# Patient Record
Sex: Male | Born: 1985 | Race: White | Hispanic: No | Marital: Single | Smoking: Never smoker
Health system: Southern US, Community
[De-identification: ages and names within clinical notes are randomized; demographics above are authoritative.]

## PROBLEM LIST (undated history)

## (undated) DIAGNOSIS — F553 Abuse of steroids or hormones: Secondary | ICD-10-CM

## (undated) DIAGNOSIS — F1910 Other psychoactive substance abuse, uncomplicated: Secondary | ICD-10-CM

## (undated) DIAGNOSIS — F988 Other specified behavioral and emotional disorders with onset usually occurring in childhood and adolescence: Secondary | ICD-10-CM

---

## 2008-11-03 ENCOUNTER — Emergency Department (HOSPITAL_BASED_OUTPATIENT_CLINIC_OR_DEPARTMENT_OTHER): Admission: EM | Admit: 2008-11-03 | Discharge: 2008-11-03 | Payer: Self-pay | Admitting: Emergency Medicine

## 2008-11-03 ENCOUNTER — Ambulatory Visit: Payer: Self-pay | Admitting: Diagnostic Radiology

## 2010-06-06 LAB — BASIC METABOLIC PANEL
BUN: 12 mg/dL (ref 6–23)
Calcium: 9.4 mg/dL (ref 8.4–10.5)
Creatinine, Ser: 1.1 mg/dL (ref 0.4–1.5)
GFR calc Af Amer: >60 mL/min (ref >60)
GFR calc non Af Amer: >60 mL/min (ref >60)

## 2010-06-06 LAB — POCT TOXICOLOGY PANEL: Tetrahydrocannabinol: POSITIVE

## 2010-06-06 LAB — DIFFERENTIAL
Basophils Relative: 1 % (ref 0–1)
Eosinophils Relative: 0 % (ref 0–5)
Lymphocytes Relative: 63 % — ABNORMAL HIGH (ref 12–46)
Monocytes Absolute: 1.5 10*3/uL — ABNORMAL HIGH (ref 0.1–1.0)
Neutrophils Relative %: 19 % — ABNORMAL LOW (ref 43–77)

## 2010-06-06 LAB — PATHOLOGIST SMEAR REVIEW

## 2010-06-06 LAB — CBC
Platelets: 169 10*3/uL (ref 150–400)
RBC: 4.47 MIL/uL (ref 4.22–5.81)
WBC: 8.9 10*3/uL (ref 4.0–10.5)

## 2010-06-15 IMAGING — CR DG CHEST 2V
2 series · 2 of 2 positions shown · non-contrast
Comparison: None.

CLINICAL DATA: Shortness of breath.  Palpitations.  .

CHEST - 2 VIEW

[w chest pa]
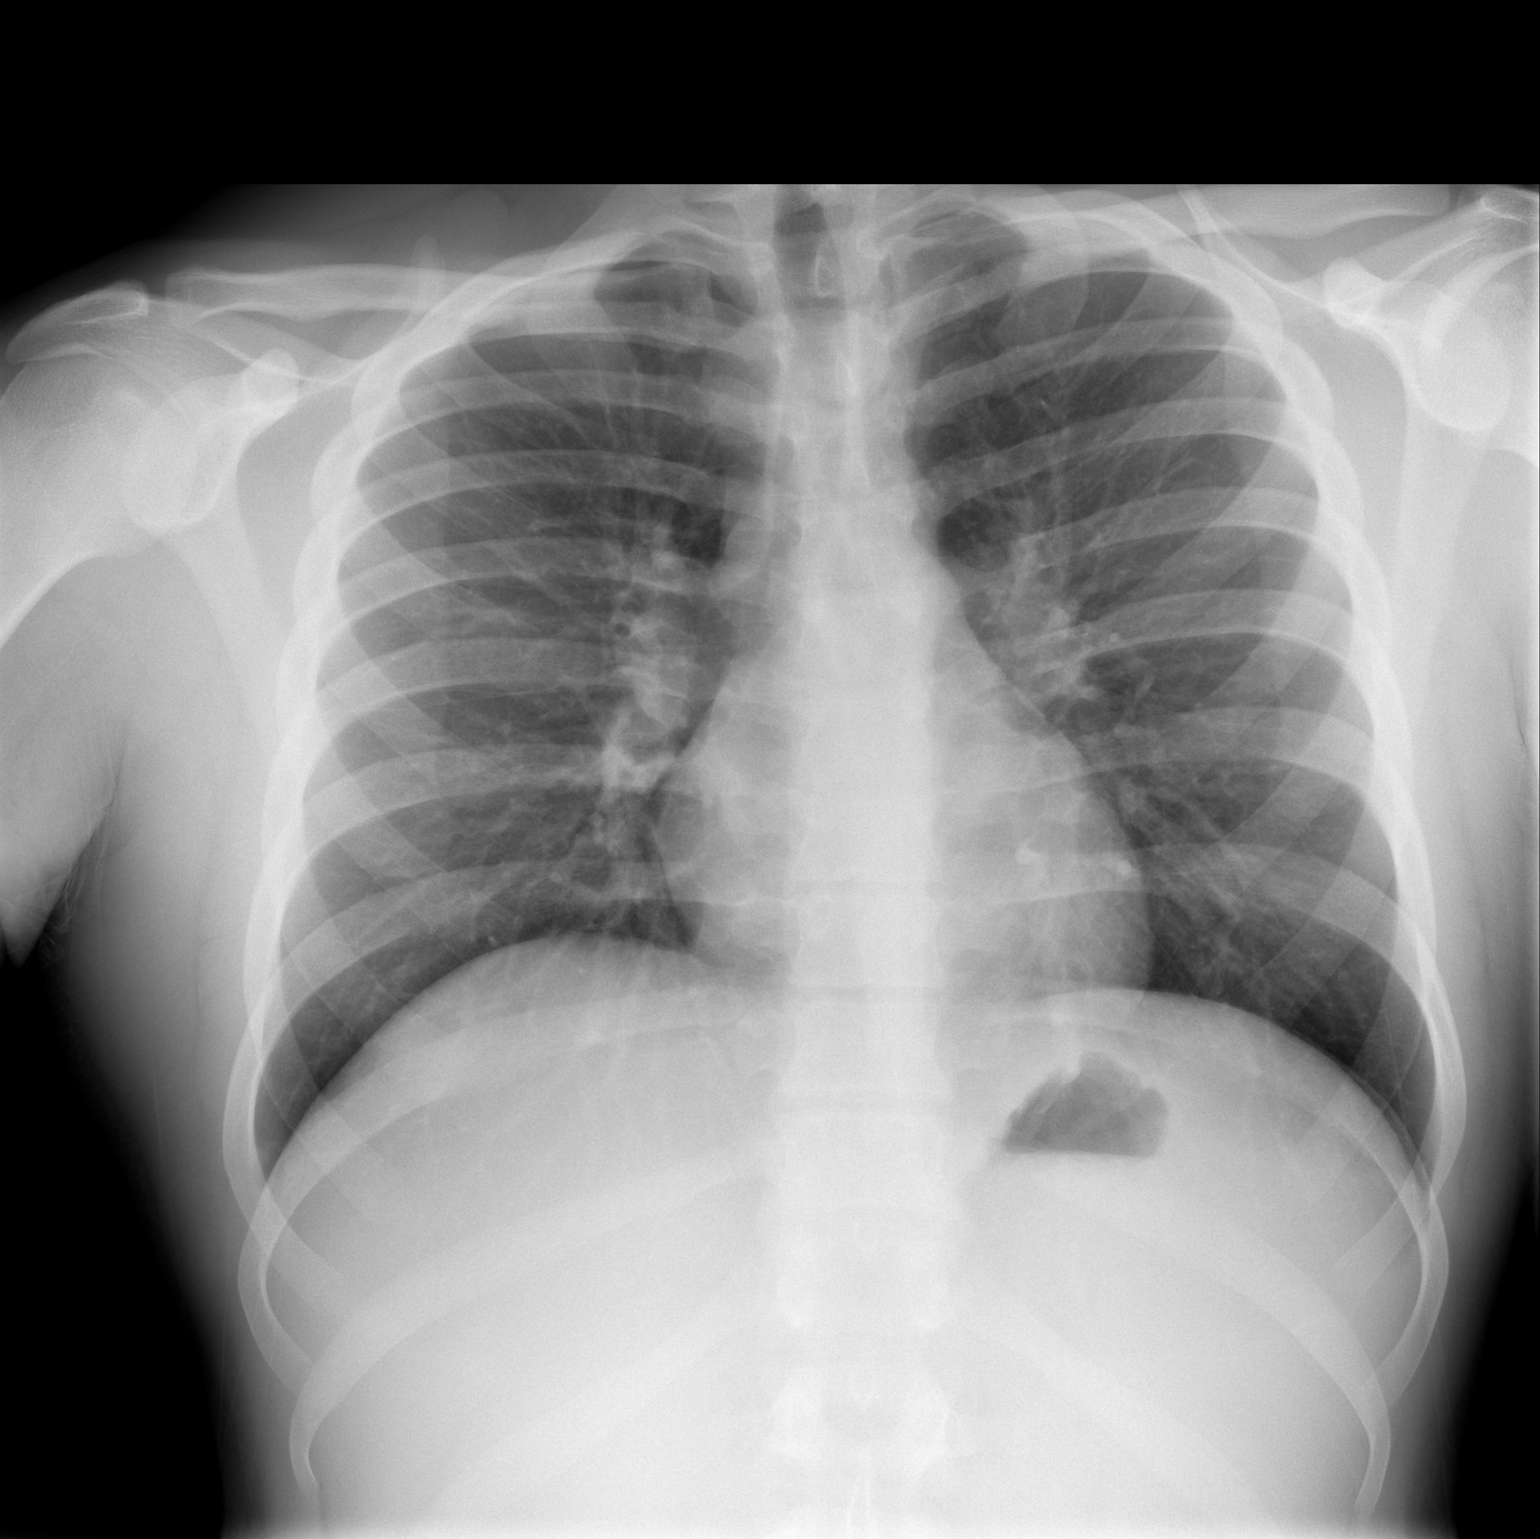

[w chest lat]
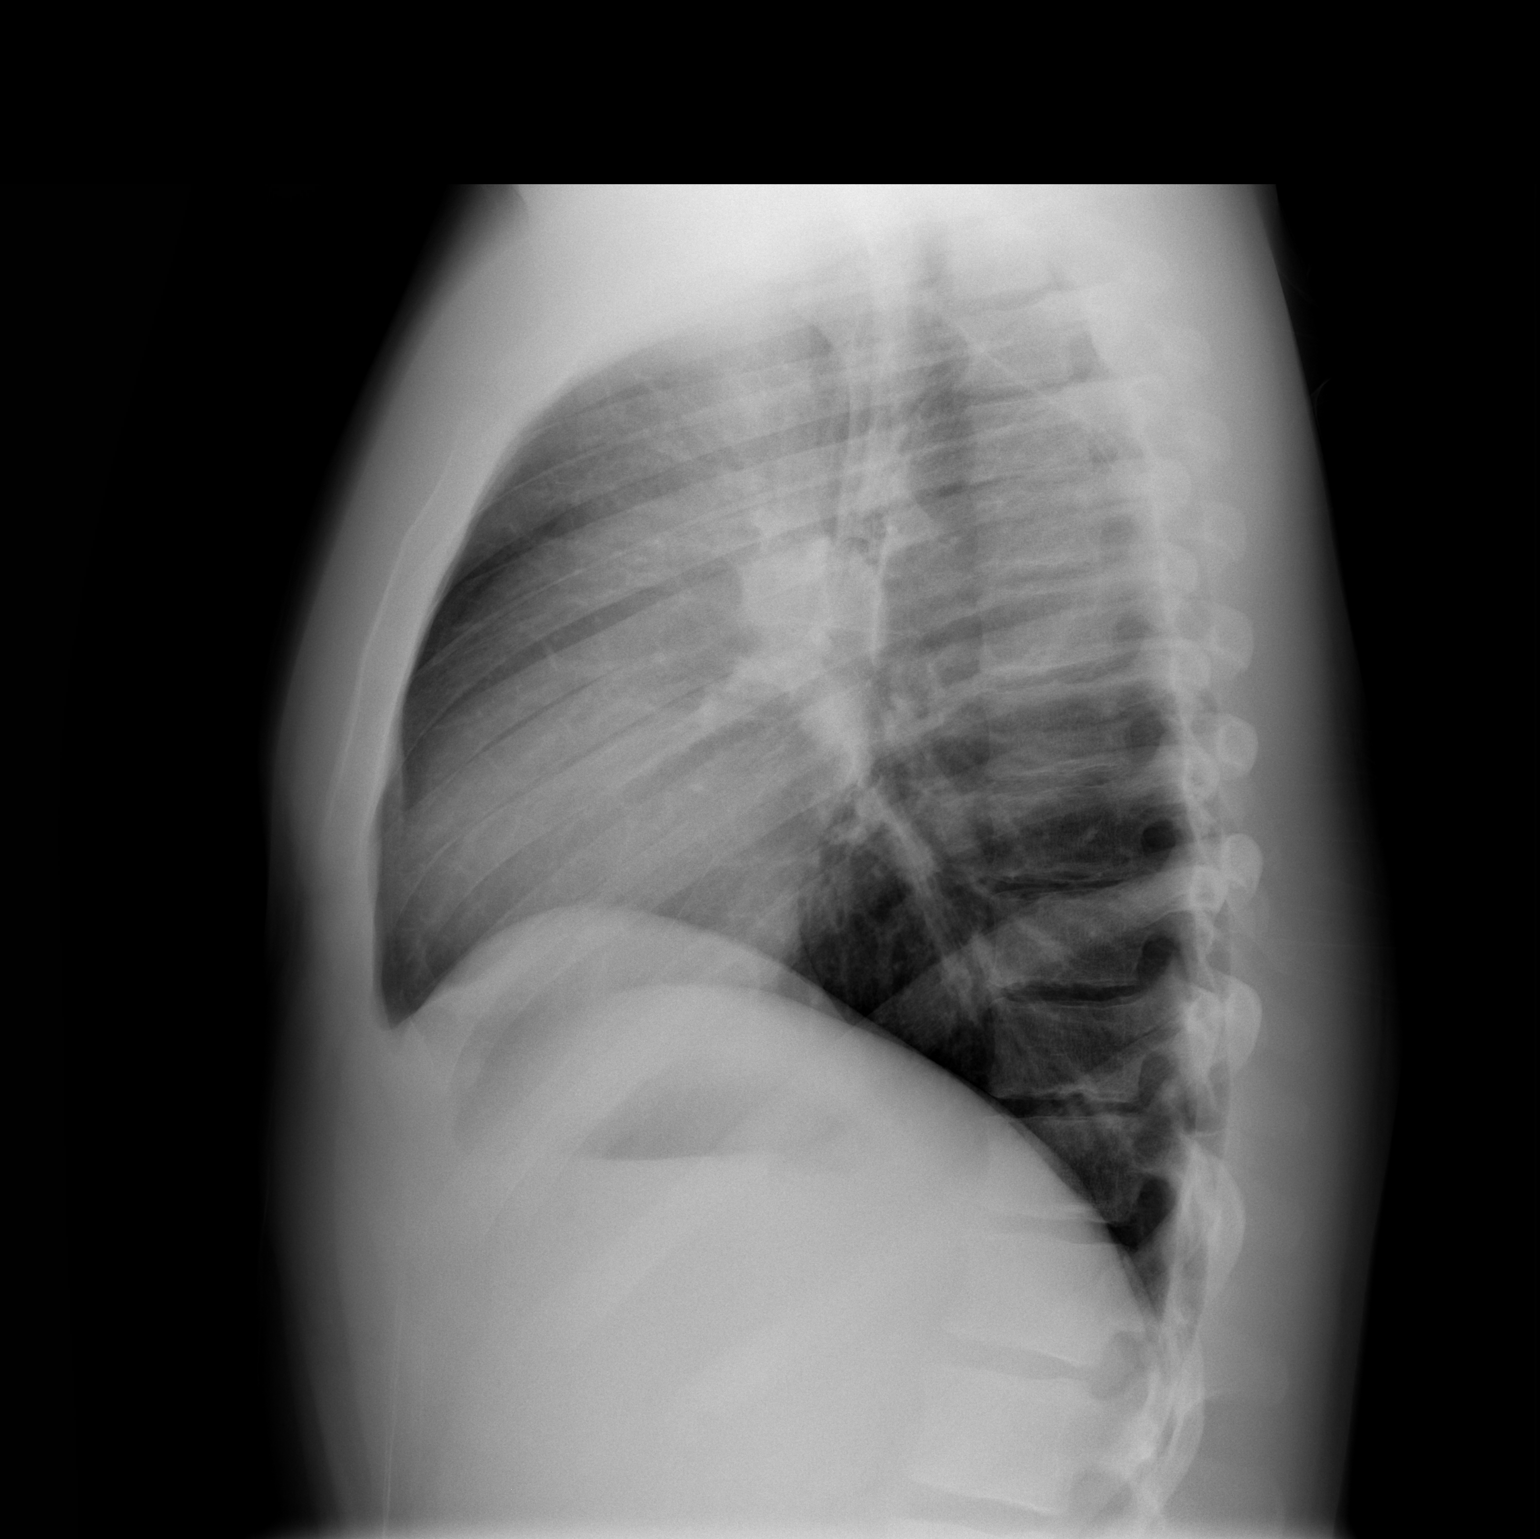

[2 of 2 positions shown; findings below may reference images not displayed]

FINDINGS: The lungs are clear without focal consolidation, edema,
effusion or pneumothorax.  Cardiopericardial silhouette is within
normal limits for size.  Imaged bony structures of the thorax are
intact.
IMPRESSION: No acute cardiopulmonary process.

## 2012-08-15 ENCOUNTER — Encounter (HOSPITAL_BASED_OUTPATIENT_CLINIC_OR_DEPARTMENT_OTHER): Payer: Self-pay | Admitting: *Deleted

## 2012-08-15 ENCOUNTER — Emergency Department (HOSPITAL_BASED_OUTPATIENT_CLINIC_OR_DEPARTMENT_OTHER)
Admission: EM | Admit: 2012-08-15 | Discharge: 2012-08-15 | Disposition: A | Discharge status: Home / Self Care | Payer: BC Managed Care – PPO | Attending: Emergency Medicine | Admitting: Emergency Medicine

## 2012-08-15 DIAGNOSIS — R634 Abnormal weight loss: Secondary | ICD-10-CM | POA: Insufficient documentation

## 2012-08-15 DIAGNOSIS — X503XXA Overexertion from repetitive movements, initial encounter: Secondary | ICD-10-CM | POA: Insufficient documentation

## 2012-08-15 DIAGNOSIS — F988 Other specified behavioral and emotional disorders with onset usually occurring in childhood and adolescence: Secondary | ICD-10-CM | POA: Insufficient documentation

## 2012-08-15 DIAGNOSIS — Z79899 Other long term (current) drug therapy: Secondary | ICD-10-CM | POA: Insufficient documentation

## 2012-08-15 DIAGNOSIS — Y9389 Activity, other specified: Secondary | ICD-10-CM | POA: Insufficient documentation

## 2012-08-15 DIAGNOSIS — S239XXA Sprain of unspecified parts of thorax, initial encounter: Secondary | ICD-10-CM | POA: Insufficient documentation

## 2012-08-15 DIAGNOSIS — S29019A Strain of muscle and tendon of unspecified wall of thorax, initial encounter: Secondary | ICD-10-CM

## 2012-08-15 DIAGNOSIS — Y9289 Other specified places as the place of occurrence of the external cause: Secondary | ICD-10-CM | POA: Insufficient documentation

## 2012-08-15 HISTORY — DX: Other specified behavioral and emotional disorders with onset usually occurring in childhood and adolescence: F98.8

## 2012-08-15 MED ORDER — IBUPROFEN 800 MG PO TABS: 800.0000 mg | ORAL_TABLET | Freq: Three times a day (TID) | ORAL | Status: DC

## 2012-08-15 MED ORDER — HYDROCODONE-ACETAMINOPHEN 5-325 MG PO TABS: 2.0000 | ORAL_TABLET | ORAL | Status: DC | PRN

## 2012-08-15 NOTE — ED Notes (Addendum)
Pt c/o lower back pain x 1 day seen by PMD today for same given flexeril w/o relief

## 2012-08-15 NOTE — ED Notes (Signed)
MD at bedside. 

## 2012-08-15 NOTE — ED Provider Notes (Signed)
History     This chart was scribed for Glynn Octave, MD by Jiles Prows, ED Scribe. The patient was seen in room MH05/MH05 and the patient's care was started at 10:46 PM.    CSN: [id number]  Arrival date & time 08/15/12  2220   Chief Complaint  Patient presents with  . Back Pain   The history is provided by the patient and medical records. No language interpreter was used.   HPI Comments: Daryl Holt is a 27 y.o. male who presents to the Emergency Department complaining of gradual onset, worsening, moderate, pain to back beginning Sunday night.  Pt reports that the pain is in the center of the middle of his back. Pt reports that the back pain stops him from exhaling all the way or taking a full breath.   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  He states that he has not lost control of bowels or bladder.  Pt denies headache, diaphoresis, fever, chills, nausea, vomiting, diarrhea, weakness, cough, SOB and any other pain.   Pt saw his PMD today and was perscribed flexeril for pain which he took with no relief.  Past Medical History  Diagnosis Date  . ADD (attention deficit disorder)     History reviewed. No pertinent past surgical history.  History reviewed. No pertinent family history.  History  Substance Use Topics  . Smoking status: Never Smoker   . Smokeless tobacco: Not on file  . Alcohol Use: No      Review of Systems A complete 10 system review of systems was obtained and all systems are negative except as noted in the HPI and PMH.   Allergies  Review of patient's allergies indicates no known allergies.  Home Medications   Current Outpatient Rx  Name  Route  Sig  Dispense  Refill  . dextroamphetamine (DEXEDRINE SPANSULE) 10 MG 24 hr capsule   Oral   Take 10 mg by mouth daily.         Marland Kitchen HYDROcodone-acetaminophen (NORCO/VICODIN) 5-325 MG per tablet   Oral   Take 2 tablets by mouth every 4 (four) hours as needed for pain.  10 tablet   0   . ibuprofen (ADVIL,MOTRIN) 800 MG tablet   Oral   Take 1 tablet (800 mg total) by mouth 3 (three) times daily.   21 tablet   0     BP 125/67  Pulse 68  Temp(Src) 97.8 F (36.6 C) (Oral)  Resp 20  Ht 5\' 11"  (1.803 m)  Wt 200 lb (90.719 kg)  BMI 27.91 kg/m2  SpO2 100%  Physical Exam  Nursing note and vitals reviewed. Constitutional: He is oriented to person, place, and time. He appears well-developed and well-nourished. No distress.  HENT:  Head: Normocephalic and atraumatic.  Mouth/Throat: Oropharynx is clear and moist.  Eyes: EOM are normal. Pupils are equal, round, and reactive to light.  Neck: Neck supple. No tracheal deviation present.  Cardiovascular: Normal rate.   Pulmonary/Chest: Effort normal. No respiratory distress.  Abdominal: Soft.  Musculoskeletal: Normal range of motion.  Thoracic paraspinal tenderness.  Midline tenderness. 5/5 strength in bilateral lower extremities. Ankle plantar and dorsiflexion intact. Great toe extension intact bilaterally. +2 DP and PT pulses. +2 patellar reflexes bilaterally. Normal gait.   Neurological: He is alert and oriented to person, place, and time.  Skin: Skin is warm and dry.  Psychiatric: He has a normal mood and affect. His behavior is normal.    ED Course  Procedures (including critical care time) DIAGNOSTIC STUDIES: Oxygen Saturation is 100% on RA, normal by my interpretation.    COORDINATION OF CARE: 10:49 PM - Discussed ED treatment with pt at bedside including motrin, heat, exercise, and pain management and pt agrees.     Labs Reviewed - No data to display No results found.   1. Strain of thoracic region, initial encounter       MDM  Mid back pain for the past day without specific injury. 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 Denies any weakness, numbness or tingling. No bowel or bladder incontinence, fever or vomiting.  Thoracic back pain worse with position change  worse with taking a deep breath. Suspect musculoskeletal origin. Low suspicion for ACS, PE, pneumothorax, pneumonia.  Patient counseled on use of narcotic pain medications. Counseled not to combine these medications with others containing tylenol. Urged not to drink alcohol, drive, or perform any other activities that requires focus while taking these medications. The patient verbalizes understanding and agrees with the plan.   I personally performed the services described in this documentation, which was scribed in my presence. The recorded information has been reviewed and is accurate.    Glynn Octave, MD 08/15/12 [phone]

## 2020-12-31 ENCOUNTER — Other Ambulatory Visit: Payer: Self-pay

## 2020-12-31 DIAGNOSIS — F19139 Other psychoactive substance abuse with withdrawal, unspecified: Secondary | ICD-10-CM | POA: Insufficient documentation

## 2020-12-31 NOTE — ED Triage Notes (Signed)
Patient arrived via POV c/o withdrawal from opiates. Patient endorses abusing fentynal, adderall, xanax. Patient endorses last use x 2 days. Patient is AO x 4, VS WDL, stumbling gait.

## 2021-01-01 ENCOUNTER — Encounter (HOSPITAL_BASED_OUTPATIENT_CLINIC_OR_DEPARTMENT_OTHER): Payer: Self-pay | Admitting: Emergency Medicine

## 2021-01-01 ENCOUNTER — Emergency Department (HOSPITAL_BASED_OUTPATIENT_CLINIC_OR_DEPARTMENT_OTHER)
Admission: EM | Admit: 2021-01-01 | Discharge: 2021-01-01 | Disposition: A | Discharge status: Home / Self Care | Payer: BC Managed Care – PPO | Attending: Emergency Medicine | Admitting: Emergency Medicine

## 2021-01-01 DIAGNOSIS — F1910 Other psychoactive substance abuse, uncomplicated: Secondary | ICD-10-CM

## 2021-01-01 NOTE — ED Provider Notes (Signed)
[facility] Provider Note: Lowella Dell, MD, FACEP  CSN: [id number] MRN: [id number] ARRIVAL: 12/31/20 at 2339 ROOM: MH07/MH07   CHIEF COMPLAINT  Withdrawal   HISTORY OF PRESENT ILLNESS  01/01/21 3:05 AM Daryl Holt is a 35 y.o. male with a longstanding history of polysubstance abuse.  He states he was having abdominal pain earlier but (7 out of 10) consistent with opioid withdrawal.  So he took fentanyl, gabapentin, Adderall and Xanax which he obtained from his father's medicine cabinet.  His withdrawal symptoms have subsequently resolved.  He did have a bad reaction (vaguely described dysphoria) to the drugs he took but states that that has also resolved.  He denies any abdominal pain at the present time.  He denies any suicidal or self-harm intention.   Past Medical History:  Diagnosis Date   ADD (attention deficit disorder)     History reviewed. No pertinent surgical history.  No family history on file.  Social History   Tobacco Use   Smoking status: Never   Smokeless tobacco: Never  Vaping Use   Vaping Use: Never used  Substance Use Topics   Alcohol use: No   Drug use: Yes    Types: Marijuana    Prior to Admission medications   Not on File    Allergies Patient has no known allergies.   REVIEW OF SYSTEMS  Negative except as noted here or in the History of Present Illness.   PHYSICAL EXAMINATION  Initial Vital Signs Blood pressure 128/87, pulse 91, temperature 98.2 F (36.8 C), temperature source Oral, resp. rate 18, height 5\' 11"  (1.803 m), weight 90.7 kg, SpO2 99 %.  Examination General: Well-developed, well-nourished male in no acute distress; appearance consistent with age of record HENT: normocephalic; atraumatic Eyes: pupils equal, round and reactive to light; extraocular muscles intact Neck: supple Heart: regular rate and rhythm Lungs: clear to auscultation bilaterally Abdomen: soft; nondistended; nontender; bowel sounds  present Extremities: No deformity; full range of motion; pulses normal Neurologic: Awake, alert and oriented; motor function intact in all extremities and symmetric; no facial droop Skin: Warm and dry Psychiatric: Normal mood and affect   RESULTS  Summary of this visit's results, reviewed and interpreted by myself:   EKG Interpretation  Date/Time:    Ventricular Rate:    PR Interval:    QRS Duration:   QT Interval:    QTC Calculation:   R Axis:     Text Interpretation:         Laboratory Studies: No results found for this or any previous visit (from the past 24 hour(s)). Imaging Studies: No results found.  ED COURSE and MDM  Nursing notes, initial and subsequent vitals signs, including pulse oximetry, reviewed and interpreted by myself.  Vitals:   01/01/21 0003 01/01/21 0003 01/01/21 0233  BP:  131/81 128/87  Pulse:  86 91  Resp:  18 18  Temp:  98.2 F (36.8 C)   TempSrc:  Oral   SpO2:  100% 99%  Weight: 90.7 kg    Height: 5\' 11"  (1.803 m)     Medications - No data to display  The patient's vital signs are normal and he is not tremulous or otherwise in any distress.  He does not meet any inpatient criteria for drug withdrawal.  We will provide referrals for outpatient programs.  PROCEDURES  Procedures   ED DIAGNOSES     ICD-10-CM   1. Polysubstance abuse (HCC)  F19.10  Kieanna Rollo, Jonny Ruiz, MD 01/01/21 [phone]

## 2021-10-14 ENCOUNTER — Emergency Department (HOSPITAL_BASED_OUTPATIENT_CLINIC_OR_DEPARTMENT_OTHER): Payer: BLUE CROSS/BLUE SHIELD

## 2021-10-14 ENCOUNTER — Other Ambulatory Visit: Payer: Self-pay

## 2021-10-14 ENCOUNTER — Other Ambulatory Visit (HOSPITAL_BASED_OUTPATIENT_CLINIC_OR_DEPARTMENT_OTHER): Payer: Self-pay

## 2021-10-14 ENCOUNTER — Emergency Department (HOSPITAL_BASED_OUTPATIENT_CLINIC_OR_DEPARTMENT_OTHER)
Admission: EM | Admit: 2021-10-14 | Discharge: 2021-10-14 | Disposition: A | Discharge status: Home / Self Care | Payer: BLUE CROSS/BLUE SHIELD | Attending: Emergency Medicine | Admitting: Emergency Medicine

## 2021-10-14 ENCOUNTER — Encounter (HOSPITAL_BASED_OUTPATIENT_CLINIC_OR_DEPARTMENT_OTHER): Payer: Self-pay | Admitting: Emergency Medicine

## 2021-10-14 DIAGNOSIS — S61411A Laceration without foreign body of right hand, initial encounter: Secondary | ICD-10-CM | POA: Diagnosis not present

## 2021-10-14 DIAGNOSIS — S60512A Abrasion of left hand, initial encounter: Secondary | ICD-10-CM | POA: Diagnosis not present

## 2021-10-14 DIAGNOSIS — S81812A Laceration without foreign body, left lower leg, initial encounter: Secondary | ICD-10-CM | POA: Diagnosis not present

## 2021-10-14 DIAGNOSIS — R400 Somnolence: Secondary | ICD-10-CM | POA: Diagnosis not present

## 2021-10-14 DIAGNOSIS — S81011A Laceration without foreign body, right knee, initial encounter: Secondary | ICD-10-CM | POA: Diagnosis not present

## 2021-10-14 DIAGNOSIS — S8991XA Unspecified injury of right lower leg, initial encounter: Secondary | ICD-10-CM | POA: Diagnosis present

## 2021-10-14 DIAGNOSIS — T07XXXA Unspecified multiple injuries, initial encounter: Secondary | ICD-10-CM

## 2021-10-14 HISTORY — DX: Abuse of steroids or hormones: F55.3

## 2021-10-14 HISTORY — DX: Other psychoactive substance abuse, uncomplicated: F19.10

## 2021-10-14 MED ORDER — METHOCARBAMOL 750 MG PO TABS
750.0000 mg | ORAL_TABLET | Freq: Three times a day (TID) | ORAL | 0 refills | Status: AC | PRN
  Filled 2021-10-14: qty 30, 10d supply, fill #0

## 2021-10-14 MED ORDER — BACITRACIN ZINC 500 UNIT/GM EX OINT
1.0000 | TOPICAL_OINTMENT | Freq: Two times a day (BID) | CUTANEOUS | 0 refills | Status: AC
  Filled 2021-10-14: qty 120, 67d supply, fill #0

## 2021-10-14 MED ORDER — LIDOCAINE HCL (PF) 1 % IJ SOLN
20.0000 mL | Freq: Once | INTRAMUSCULAR | Status: AC
  Administered 2021-10-14: 20 mL
  Filled 2021-10-14: qty 20

## 2021-10-14 MED ORDER — IBUPROFEN 800 MG PO TABS
800.0000 mg | ORAL_TABLET | Freq: Three times a day (TID) | ORAL | 0 refills | Status: AC
  Filled 2021-10-14: qty 21, 7d supply, fill #0

## 2021-10-14 NOTE — ED Notes (Signed)
Wounds cleansed with wound spray, saline and betadine. Dressing applied with guaze & tape

## 2021-10-14 NOTE — ED Notes (Signed)
Pt asleep while receiving sutures

## 2021-10-14 NOTE — ED Notes (Signed)
Bacitracin and clean gauze applied to wounds

## 2021-10-14 NOTE — ED Notes (Signed)
Patient transported to CT 
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
## 2021-10-14 NOTE — ED Notes (Signed)
Provider at bedside, suturing
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
